# Patient Record
Sex: Female | Born: 1990 | Race: Black or African American | Hispanic: No | Marital: Single | Smoking: Current every day smoker
Health system: Southern US, Community
[De-identification: ages and names within clinical notes are randomized; demographics above are authoritative.]

## PROBLEM LIST (undated history)

## (undated) DIAGNOSIS — R87619 Unspecified abnormal cytological findings in specimens from cervix uteri: Secondary | ICD-10-CM

## (undated) DIAGNOSIS — IMO0002 Reserved for concepts with insufficient information to code with codable children: Secondary | ICD-10-CM

## (undated) DIAGNOSIS — A749 Chlamydial infection, unspecified: Secondary | ICD-10-CM

## (undated) DIAGNOSIS — J302 Other seasonal allergic rhinitis: Secondary | ICD-10-CM

## (undated) HISTORY — PX: NO PAST SURGERIES: SHX2092

---
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
## 2012-04-07 ENCOUNTER — Encounter (HOSPITAL_COMMUNITY): Payer: Self-pay | Admitting: *Deleted

## 2012-04-07 ENCOUNTER — Emergency Department (HOSPITAL_COMMUNITY)
Admission: EM | Admit: 2012-04-07 | Discharge: 2012-04-07 | Disposition: A | Discharge status: Home / Self Care | Payer: Medicaid Other | Attending: Emergency Medicine | Admitting: Emergency Medicine

## 2012-04-07 DIAGNOSIS — Z3202 Encounter for pregnancy test, result negative: Secondary | ICD-10-CM | POA: Insufficient documentation

## 2012-04-07 DIAGNOSIS — N926 Irregular menstruation, unspecified: Secondary | ICD-10-CM

## 2012-04-07 DIAGNOSIS — Z87891 Personal history of nicotine dependence: Secondary | ICD-10-CM | POA: Insufficient documentation

## 2012-04-07 DIAGNOSIS — N898 Other specified noninflammatory disorders of vagina: Secondary | ICD-10-CM | POA: Insufficient documentation

## 2012-04-07 DIAGNOSIS — J45909 Unspecified asthma, uncomplicated: Secondary | ICD-10-CM | POA: Insufficient documentation

## 2012-04-07 LAB — PREGNANCY, URINE: Preg Test, Ur: NEGATIVE

## 2012-04-07 LAB — COMPREHENSIVE METABOLIC PANEL
Alkaline Phosphatase: 75 U/L (ref 39–117)
BUN: 7 mg/dL (ref 6–23)
Creatinine, Ser: 0.7 mg/dL (ref 0.50–1.10)
GFR calc Af Amer: >90 mL/min (ref >90)
Glucose, Bld: 86 mg/dL (ref 70–99)
Potassium: 3.3 mEq/L — ABNORMAL LOW (ref 3.5–5.1)
Total Bilirubin: 0.2 mg/dL — ABNORMAL LOW (ref 0.3–1.2)
Total Protein: 8.4 g/dL — ABNORMAL HIGH (ref 6.0–8.3)

## 2012-04-07 LAB — CBC WITH DIFFERENTIAL/PLATELET
Basophils Absolute: 0 K/uL (ref 0.0–0.1)
Basophils Relative: 0 % (ref 0–1)
Eosinophils Absolute: 0.2 K/uL (ref 0.0–0.7)
Eosinophils Relative: 3 % (ref 0–5)
HCT: 42.5 % (ref 36.0–46.0)
Hemoglobin: 14.5 g/dL (ref 12.0–15.0)
Lymphocytes Relative: 56 % — ABNORMAL HIGH (ref 12–46)
Lymphs Abs: 4.3 K/uL — ABNORMAL HIGH (ref 0.7–4.0)
MCH: 29.8 pg (ref 26.0–34.0)
MCHC: 34.1 g/dL (ref 30.0–36.0)
MCV: 87.4 fL (ref 78.0–100.0)
Monocytes Absolute: 0.6 K/uL (ref 0.1–1.0)
Monocytes Relative: 8 % (ref 3–12)
Neutro Abs: 2.5 K/uL (ref 1.7–7.7)
Neutrophils Relative %: 33 % — ABNORMAL LOW (ref 43–77)
Platelets: 355 K/uL (ref 150–400)
RBC: 4.86 MIL/uL (ref 3.87–5.11)
RDW: 11.8 % (ref 11.5–15.5)
WBC: 7.6 K/uL (ref 4.0–10.5)

## 2012-04-07 LAB — URINALYSIS, ROUTINE W REFLEX MICROSCOPIC
Bilirubin Urine: NEGATIVE
Glucose, UA: NEGATIVE mg/dL
Hgb urine dipstick: NEGATIVE
Ketones, ur: NEGATIVE mg/dL
Protein, ur: NEGATIVE mg/dL
pH: 7 (ref 5.0–8.0)

## 2012-04-07 NOTE — ED Notes (Signed)
The pt is here because her period was not as heavy as usual.  Her period started on the 5th and ended today.  She felt lightheaded when she woke up from her nap today.  lmp sept 5th

## 2012-04-07 NOTE — ED Provider Notes (Signed)
History     CSN: [id number]  Arrival date & time 04/07/12  1932   First MD Initiated Contact with Patient 04/07/12 2124      Chief Complaint  Patient presents with  . difficulty with periods     (Consider location/radiation/quality/duration/timing/severity/associated sxs/prior treatment) HPI Comments: Because her period was lighter than normal wants to make sure she isn't pregnant  Patient is a 21 y.o. female presenting with vaginal bleeding. The history is provided by the patient.  Vaginal Bleeding Chronicity: lighter periods than normal. Episode onset: for the last 5 days. The problem has been gradually improving. Pertinent negatives include no chest pain, no abdominal pain, no headaches and no shortness of breath. Nothing aggravates the symptoms. Nothing relieves the symptoms. She has tried nothing for the symptoms.    Past Medical History  Diagnosis Date  . Asthma   . Abnormal Pap smear     repeat WNL  . Chlamydia 2011    treated  . Seasonal allergies     Past Surgical History  Procedure Date  . No past surgeries     Family History  Problem Relation Age of Onset  . Anesthesia problems Neg Hx     History  Substance Use Topics  . Smoking status: Former Smoker    Types: Cigarettes  . Smokeless tobacco: Not on file  . Alcohol Use: No    OB History    Grav Para Term Preterm Abortions TAB SAB Ect Mult Living   0 0 0 0 0 0 0 0 0 0       Review of Systems  Respiratory: Negative for shortness of breath.   Cardiovascular: Negative for chest pain.  Gastrointestinal: Negative for abdominal pain.  Genitourinary: Positive for vaginal bleeding.  Neurological: Negative for headaches.  All other systems reviewed and are negative.    Allergies  Shellfish allergy and Medroxyprogesterone acetate  Home Medications   Current Outpatient Rx  Name Route Sig Dispense Refill  . ALBUTEROL SULFATE HFA 108 (90 BASE) MCG/ACT IN AERS Inhalation Inhale 2 puffs into the  lungs every 6 (six) hours as needed. weezing      BP 126/71  Pulse 86  Temp 98.8 F (37.1 C) (Oral)  Resp 20  SpO2 96%  LMP 04/02/2012  Physical Exam  Nursing note and vitals reviewed. Constitutional: She is oriented to person, place, and time. She appears well-developed and well-nourished. No distress.  HENT:  Head: Normocephalic and atraumatic.  Eyes: EOM are normal. Pupils are equal, round, and reactive to light.  Cardiovascular: Normal rate, regular rhythm, normal heart sounds and intact distal pulses.  Exam reveals no friction rub.   No murmur heard. Pulmonary/Chest: Effort normal and breath sounds normal. She has no wheezes. She has no rales.  Abdominal: Soft. Bowel sounds are normal. She exhibits no distension. There is no tenderness. There is no rebound and no guarding.  Musculoskeletal: Normal range of motion. She exhibits no tenderness.       No edema  Neurological: She is alert and oriented to person, place, and time. No cranial nerve deficit.  Skin: Skin is warm and dry. No rash noted.  Psychiatric: She has a normal mood and affect. Her behavior is normal.    ED Course  Procedures (including critical care time)  Labs Reviewed  CBC WITH DIFFERENTIAL - Abnormal; Notable for the following:    Neutrophils Relative 33 (*)     Lymphocytes Relative 56 (*)     Lymphs Abs 4.3 (*)  All other components within normal limits  COMPREHENSIVE METABOLIC PANEL - Abnormal; Notable for the following:    Potassium 3.3 (*)     Total Protein 8.4 (*)     Total Bilirubin 0.2 (*)     All other components within normal limits  URINALYSIS, ROUTINE W REFLEX MICROSCOPIC  PREGNANCY, URINE   No results found.   No diagnosis found.    MDM   Patient is here because her period was lighter than normal this month. She denies missing any menses and denies any other symptoms. Patient practices unprotected sex with one partner. She denies any vaginal itching or discharge. Denies any  dysuria.  UA negative and UPT negative. Patient states she came to make sure she was not pregnant. She has no significant exam findings        Gwyneth Sprout, MD 04/07/12 2204

## 2012-11-04 ENCOUNTER — Encounter (HOSPITAL_COMMUNITY): Payer: Self-pay | Admitting: Emergency Medicine

## 2012-11-04 ENCOUNTER — Emergency Department (HOSPITAL_COMMUNITY)
Admission: EM | Admit: 2012-11-04 | Discharge: 2012-11-05 | Disposition: A | Discharge status: Home / Self Care | Payer: Medicaid Other | Attending: Emergency Medicine | Admitting: Emergency Medicine

## 2012-11-04 DIAGNOSIS — R197 Diarrhea, unspecified: Secondary | ICD-10-CM | POA: Insufficient documentation

## 2012-11-04 DIAGNOSIS — R112 Nausea with vomiting, unspecified: Secondary | ICD-10-CM | POA: Insufficient documentation

## 2012-11-04 DIAGNOSIS — R1032 Left lower quadrant pain: Secondary | ICD-10-CM | POA: Insufficient documentation

## 2012-11-04 DIAGNOSIS — Z8619 Personal history of other infectious and parasitic diseases: Secondary | ICD-10-CM | POA: Insufficient documentation

## 2012-11-04 DIAGNOSIS — Z79899 Other long term (current) drug therapy: Secondary | ICD-10-CM | POA: Insufficient documentation

## 2012-11-04 DIAGNOSIS — Z3202 Encounter for pregnancy test, result negative: Secondary | ICD-10-CM | POA: Insufficient documentation

## 2012-11-04 DIAGNOSIS — F17200 Nicotine dependence, unspecified, uncomplicated: Secondary | ICD-10-CM | POA: Insufficient documentation

## 2012-11-04 DIAGNOSIS — J45909 Unspecified asthma, uncomplicated: Secondary | ICD-10-CM | POA: Insufficient documentation

## 2012-11-04 LAB — URINALYSIS, MICROSCOPIC ONLY
Ketones, ur: NEGATIVE mg/dL
Leukocytes, UA: NEGATIVE
Nitrite: NEGATIVE
Protein, ur: NEGATIVE mg/dL
pH: 5.5 (ref 5.0–8.0)

## 2012-11-04 NOTE — ED Notes (Addendum)
C/o diarrhea x 1 and vomited x 2 today. Denies pain. LMP in February.

## 2012-11-05 MED ORDER — ONDANSETRON 4 MG PO TBDP
8.0000 mg | ORAL_TABLET | Freq: Once | ORAL | Status: AC
  Administered 2012-11-05: 8 mg via ORAL
  Filled 2012-11-05: qty 2

## 2012-11-05 MED ORDER — ONDANSETRON 8 MG PO TBDP: ORAL_TABLET | ORAL | Status: DC

## 2012-11-05 NOTE — ED Notes (Signed)
The pt reports that she has not had nv since she was given the med here

## 2012-11-05 NOTE — ED Notes (Signed)
The pt has had nv and diarrhea  Since this am .  She ate at the [employer] yesterday and members of her party have also been vomiting  Everyone that ate there.

## 2012-11-05 NOTE — ED Provider Notes (Signed)
History  This chart was scribed for Teshawn Moan Smitty Cords, MD by Shari Heritage, ED Scribe. The patient was seen in room D34C/D34C. Patient's care was started at 0001.  CSN: [id number]  Arrival date & time 11/04/12  2051   First MD Initiated Contact with Patient 11/05/12 0001      Chief Complaint  Patient presents with  . Emesis     Patient is a 22 y.o. female presenting with vomiting. The history is provided by the patient. No language interpreter was used.  Emesis Severity:  Mild Duration:  1 day Timing:  Sporadic Number of daily episodes:  2 Quality:  Stomach contents Progression:  Unchanged Chronicity:  New Relieved by:  Nothing Worsened by:  Nothing tried Ineffective treatments:  None tried Associated symptoms: abdominal pain and diarrhea   Associated symptoms: no chills, no headaches and no sore throat   Abdominal pain:    Location:  LLQ   Quality:  Cramping   Severity:  Mild   Timing:  Rare   Progression:  Unchanged   Chronicity:  New Diarrhea:    Quality:  Watery   Number of occurrences:  2   Severity:  Mild   Duration:  1 day   Timing:  Sporadic   Progression:  Unchanged    HPI Comments: Katherine Clay is a 22 y.o. female who presents to the Emergency Department complaining of diarrhea and emesis onset yesterday morning. She reports 2 episodes of vomiting and diarrhea each. Her last episodes of each were immediately prior to arrival.  There is associated intermittent abdominal cramping. Patient states that she came in contact with her nephew who attends daycare where several other children have exhibited similar symptoms. She denies fever, chills, sore throat, back pain, flank pain, chest pain, shortness of breath, rash, dysuria, hematuria, headaches, urinary frequency, or visual changes. She has a medical history of asthma. She is a current every day smoker and does not alcohol.  Past Medical History  Diagnosis Date  . Asthma   . Abnormal Pap smear     repeat  WNL  . Chlamydia 2011    treated  . Seasonal allergies     Past Surgical History  Procedure Laterality Date  . No past surgeries      Family History  Problem Relation Age of Onset  . Anesthesia problems Neg Hx     History  Substance Use Topics  . Smoking status: Current Every Day Smoker    Types: Cigarettes  . Smokeless tobacco: Not on file  . Alcohol Use: No    OB History   Grav Para Term Preterm Abortions TAB SAB Ect Mult Living   0 0 0 0 0 0 0 0 0 0       Review of Systems  Constitutional: Negative for fever and chills.  HENT: Negative for sore throat, neck pain and neck stiffness.   Eyes: Negative for visual disturbance.  Respiratory: Negative for shortness of breath.   Cardiovascular: Negative for chest pain.  Gastrointestinal: Positive for vomiting, abdominal pain and diarrhea.  Genitourinary: Negative for dysuria, urgency, frequency, hematuria and flank pain.  Musculoskeletal: Negative for back pain.  Skin: Negative for rash.  Neurological: Negative for headaches.  All other systems reviewed and are negative.    Allergies  Shellfish allergy and Medroxyprogesterone acetate  Home Medications   Current Outpatient Rx  Name  Route  Sig  Dispense  Refill  . albuterol (PROVENTIL HFA;VENTOLIN HFA) 108 (90 BASE) MCG/ACT inhaler   Inhalation  Inhale 2 puffs into the lungs every 6 (six) hours as needed. weezing         . albuterol (PROVENTIL) (2.5 MG/3ML) 0.083% nebulizer solution   Nebulization   Take 2.5 mg by nebulization every 6 (six) hours as needed for wheezing or shortness of breath (during allergy flare ups).           Triage Vitals: BP 122/66  Pulse 96  Temp(Src) 98.4 F (36.9 C) (Oral)  Resp 18  SpO2 97%  LMP 09/12/2012  Physical Exam  Constitutional: She is oriented to person, place, and time. She appears well-developed and well-nourished. No distress.  HENT:  Head: Normocephalic and atraumatic.  Mouth/Throat: Oropharynx is clear  and moist.  Eyes: Conjunctivae and EOM are normal. Pupils are equal, round, and reactive to light.  Neck: Normal range of motion. Neck supple.  Cardiovascular: Normal rate, regular rhythm and normal heart sounds.   Pulmonary/Chest: Effort normal and breath sounds normal.  Abdominal: Soft. She exhibits no distension and no mass. There is no tenderness. There is no rebound and no guarding.  Hyperactive bowel sounds.  Musculoskeletal: Normal range of motion.  Neurological: She is alert and oriented to person, place, and time. She has normal reflexes. She displays normal reflexes.  Skin: Skin is warm and dry. No rash noted. No erythema.  Psychiatric: She has a normal mood and affect.    ED Course  Procedures (including critical care time) DIAGNOSTIC STUDIES: Oxygen Saturation is 97% on room air, adequate by my interpretation.    COORDINATION OF CARE: 12:21 AM- Patient informed of current plan for treatment and evaluation and agrees with plan at this time.    Labs Reviewed  URINALYSIS, MICROSCOPIC ONLY - Abnormal; Notable for the following:    Squamous Epithelial / LPF FEW (*)    All other components within normal limits  POCT PREGNANCY, URINE    No results found.   No diagnosis found.    MDM  Patient is a 22 y.o. Female who presents after 1 day of vomiting and diarrhea. Patient had no significant abdominal tenderness on exam. UA was unremarkable. Suspect gastroenteritis. Gave Zofran-ODT in the ED. Patient tolerating PO in the ED.  Return for worsening symptoms.      I personally performed the services described in this documentation, which was scribed in my presence. The recorded information has been reviewed and is accurate.    Jasmine Awe, MD 11/05/12 [phone]

## 2013-08-20 IMAGING — CR DG CHEST 2V
2 series · 2 of 2 positions shown · non-contrast
Comparison: 05/09/2011.

CLINICAL DATA: 21-year-old female with cough, shortness of breath.

CHEST - 2 VIEW

[w chest pa]
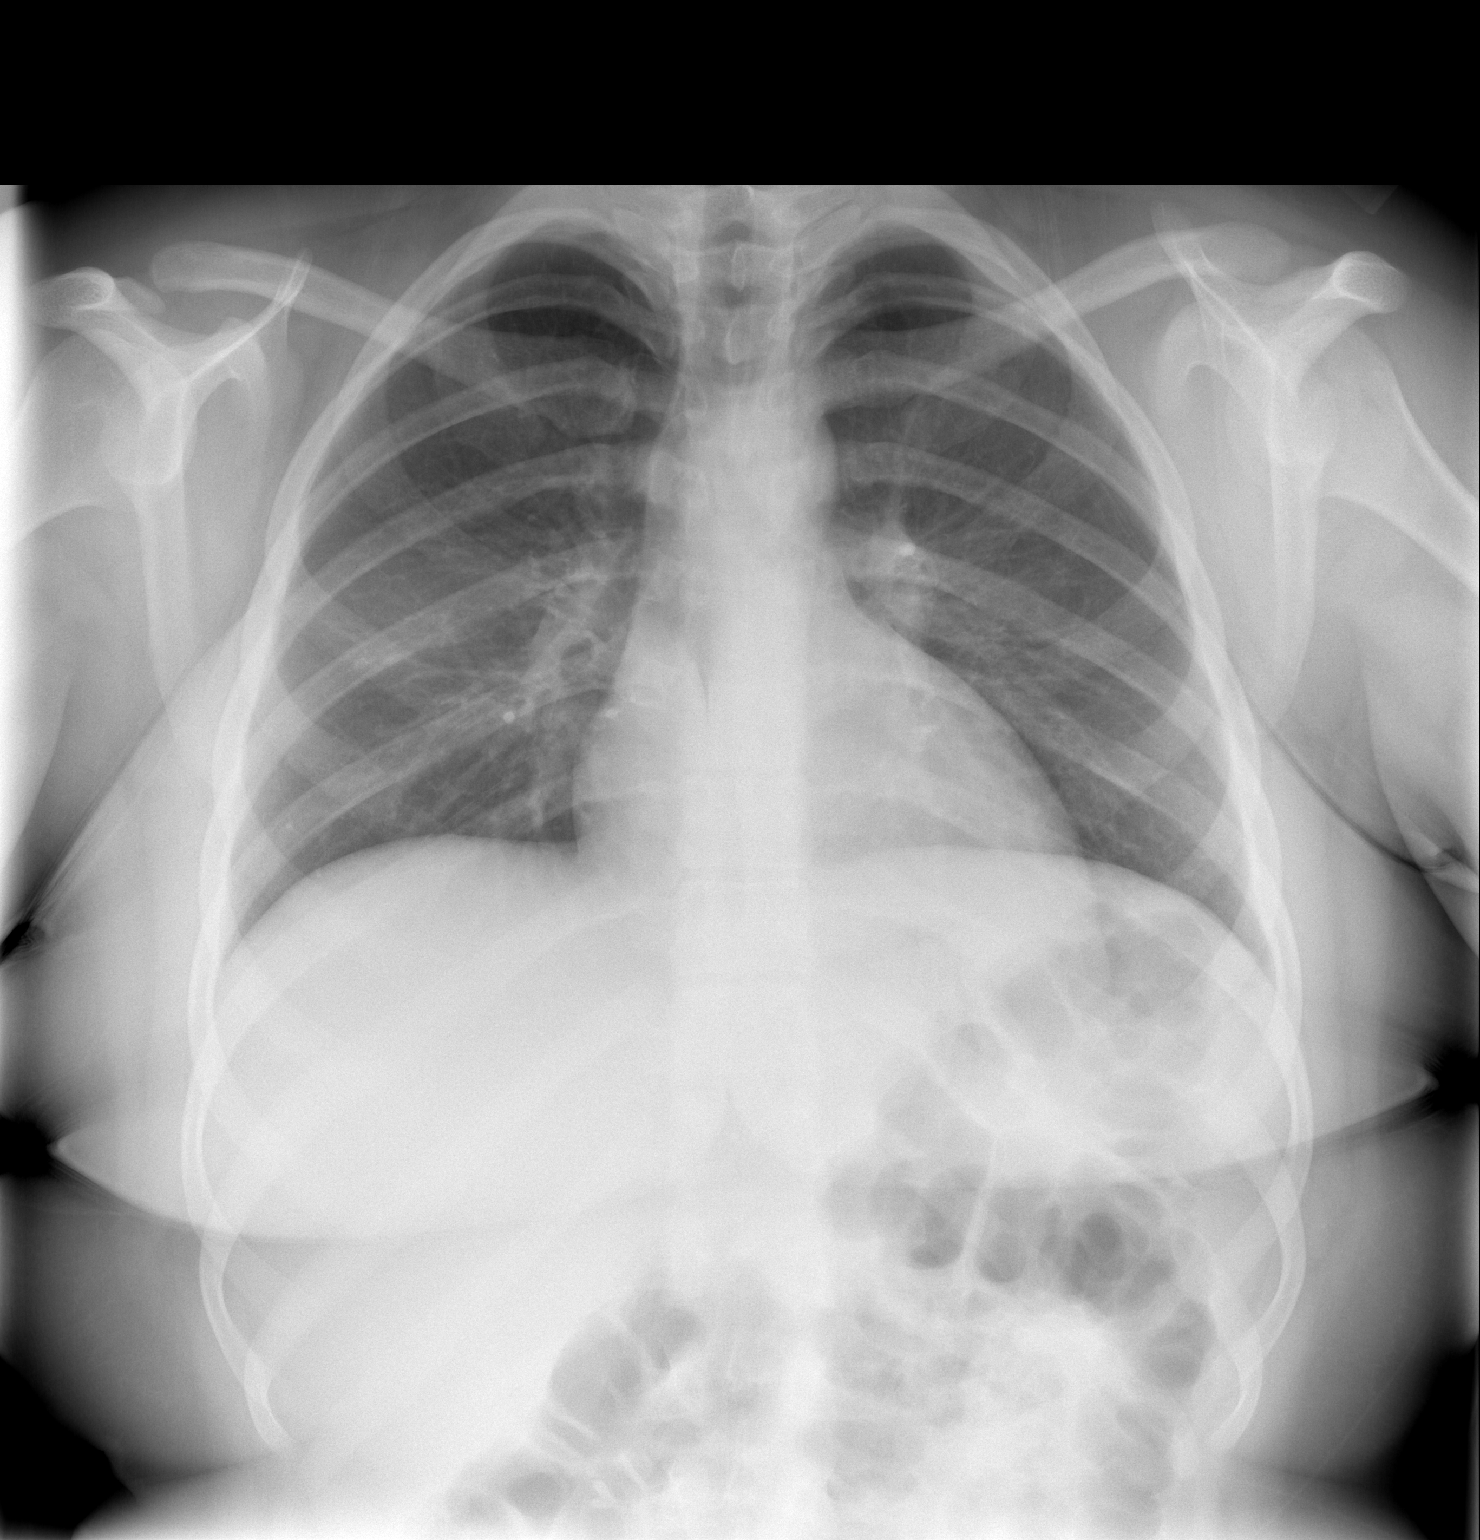

[w chest lat]
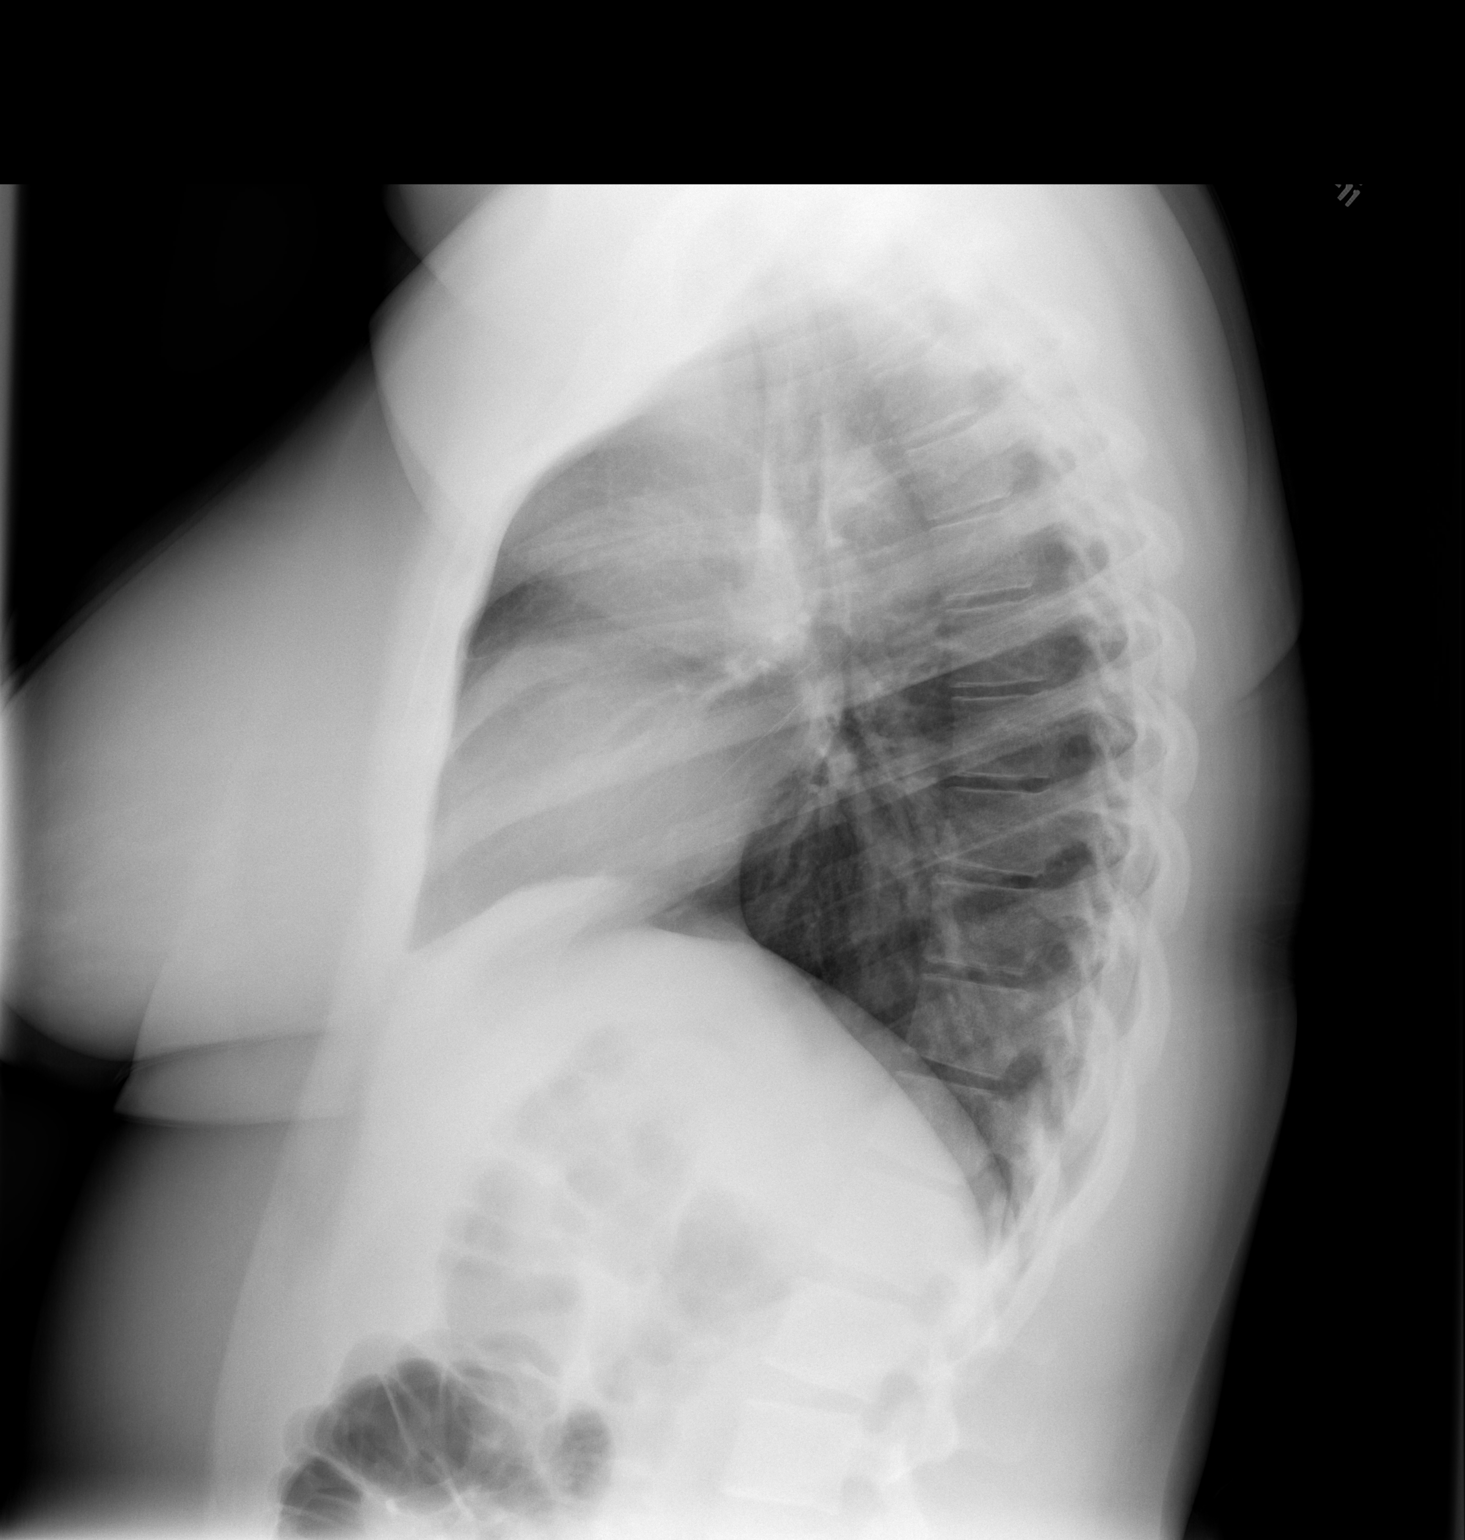

[2 of 2 positions shown; findings below may reference images not displayed]

FINDINGS: Slightly lower lung volumes.  Cardiac size and
mediastinal contours are within normal limits.  The lungs are
clear.  No pneumothorax or effusion.  Negative visualized bowel gas
and osseous structures.
IMPRESSION: No acute cardiopulmonary abnormality.

## 2014-04-30 ENCOUNTER — Emergency Department (HOSPITAL_COMMUNITY)
Admission: EM | Admit: 2014-04-30 | Discharge: 2014-04-30 | Disposition: A | Discharge status: Home / Self Care | Payer: Medicaid Other | Attending: Emergency Medicine | Admitting: Emergency Medicine

## 2014-04-30 ENCOUNTER — Encounter (HOSPITAL_COMMUNITY): Payer: Self-pay | Admitting: Emergency Medicine

## 2014-04-30 DIAGNOSIS — Z8619 Personal history of other infectious and parasitic diseases: Secondary | ICD-10-CM | POA: Diagnosis not present

## 2014-04-30 DIAGNOSIS — J45901 Unspecified asthma with (acute) exacerbation: Secondary | ICD-10-CM | POA: Diagnosis not present

## 2014-04-30 DIAGNOSIS — J0191 Acute recurrent sinusitis, unspecified: Secondary | ICD-10-CM | POA: Diagnosis not present

## 2014-04-30 DIAGNOSIS — Z720 Tobacco use: Secondary | ICD-10-CM | POA: Diagnosis not present

## 2014-04-30 DIAGNOSIS — J069 Acute upper respiratory infection, unspecified: Secondary | ICD-10-CM | POA: Insufficient documentation

## 2014-04-30 DIAGNOSIS — J019 Acute sinusitis, unspecified: Secondary | ICD-10-CM

## 2014-04-30 DIAGNOSIS — R05 Cough: Secondary | ICD-10-CM | POA: Diagnosis present

## 2014-04-30 MED ORDER — ALBUTEROL SULFATE HFA 108 (90 BASE) MCG/ACT IN AERS: 1.0000 | INHALATION_SPRAY | Freq: Four times a day (QID) | RESPIRATORY_TRACT | Status: AC | PRN

## 2014-04-30 MED ORDER — BENZONATATE 100 MG PO CAPS: 100.0000 mg | ORAL_CAPSULE | Freq: Three times a day (TID) | ORAL | Status: AC

## 2014-04-30 MED ORDER — AZITHROMYCIN 250 MG PO TABS: 250.0000 mg | ORAL_TABLET | Freq: Every day | ORAL | Status: AC

## 2014-04-30 MED ORDER — ALBUTEROL SULFATE (2.5 MG/3ML) 0.083% IN NEBU
5.0000 mg | INHALATION_SOLUTION | Freq: Once | RESPIRATORY_TRACT | Status: AC
  Administered 2014-04-30: 5 mg via RESPIRATORY_TRACT
  Filled 2014-04-30: qty 6

## 2014-04-30 MED ORDER — PREDNISONE 20 MG PO TABS
60.0000 mg | ORAL_TABLET | Freq: Once | ORAL | Status: AC
  Administered 2014-04-30: 60 mg via ORAL
  Filled 2014-04-30: qty 3

## 2014-04-30 MED ORDER — PREDNISONE 20 MG PO TABS: 60.0000 mg | ORAL_TABLET | Freq: Every day | ORAL | Status: AC

## 2014-04-30 MED ORDER — IPRATROPIUM BROMIDE 0.02 % IN SOLN
0.5000 mg | Freq: Once | RESPIRATORY_TRACT | Status: AC
  Administered 2014-04-30: 0.5 mg via RESPIRATORY_TRACT
  Filled 2014-04-30: qty 2.5

## 2014-04-30 NOTE — ED Notes (Signed)
Pt reports cough, congestion x 3 days; pt reports sinus and chest congestion; NP cough; denies difficulty breathing

## 2014-04-30 NOTE — ED Provider Notes (Signed)
Medical screening examination/treatment/procedure(s) were performed by non-physician practitioner and as supervising physician I was immediately available for consultation/collaboration.   EKG Interpretation None        Polette Nofsinger, MD 04/30/14 0602 

## 2014-04-30 NOTE — ED Provider Notes (Signed)
CSN: [id number]     Arrival date & time 04/30/14  0218 History   First MD Initiated Contact with Patient 04/30/14 [phone]     Chief Complaint  Patient presents with  . URI     (Consider location/radiation/quality/duration/timing/severity/associated sxs/prior Treatment) HPI Comments: Patient with a history of Asthma presents today with a chief complaint of non productive cough, nasal congestion, and sinus pain that has been present for the past 3 days.  Symptoms gradually worsening.  She also reports that today she began wheezing and had tightness in her chest.  She has not taken anything for her symptoms.  She states that she is out of her inhaler.  She denies fever, chills, chest pain, nausea, or vomiting.  The history is provided by the patient.    Past Medical History  Diagnosis Date  . Asthma   . Abnormal Pap smear     repeat WNL  . Chlamydia 2011    treated  . Seasonal allergies    Past Surgical History  Procedure Laterality Date  . No past surgeries     Family History  Problem Relation Age of Onset  . Anesthesia problems Neg Hx    History  Substance Use Topics  . Smoking status: Current Every Day Smoker    Types: Cigarettes  . Smokeless tobacco: Not on file  . Alcohol Use: No   OB History   Grav Para Term Preterm Abortions TAB SAB Ect Mult Living   0 0 0 0 0 0 0 0 0 0      Review of Systems  All other systems reviewed and are negative.     Allergies  Shellfish allergy and Medroxyprogesterone acetate  Home Medications   Prior to Admission medications   Not on File   BP 131/62  Pulse 85  Temp(Src) 98.6 F (37 C) (Oral)  Resp 20  Wt 190 lb 12.8 oz (86.546 kg)  SpO2 97%  LMP 03/21/2014 Physical Exam  Nursing note and vitals reviewed. Constitutional: She appears well-developed and well-nourished.  HENT:  Head: Normocephalic and atraumatic.  Right Ear: Tympanic membrane and ear canal normal.  Left Ear: Tympanic membrane and ear canal normal.  Nose:  Right sinus exhibits frontal sinus tenderness. Left sinus exhibits frontal sinus tenderness.  Mouth/Throat: Uvula is midline, oropharynx is clear and moist and mucous membranes are normal.  Neck: Normal range of motion. Neck supple.  Cardiovascular: Normal rate, regular rhythm and normal heart sounds.   Pulmonary/Chest: Effort normal. No respiratory distress. She has wheezes. She has no rales. She exhibits no tenderness.  Diffuse expiratory wheezing Patient able to speak in complete sentences  Neurological: She is alert.  Skin: Skin is warm and dry.  Psychiatric: She has a normal mood and affect.    ED Course  Procedures (including critical care time) Labs Review Labs Reviewed - No data to display  Imaging Review No results found.   EKG Interpretation None     4:18 AM Reassessed patient.  She reports that her breathing has improved.  Lungs CTAB.   MDM   Final diagnoses:  Asthma exacerbation  URI (upper respiratory infection)  Acute sinusitis, recurrence not specified, unspecified location   Patient with a history of Asthma presents today with a chief complaint of cough, wheezing, sinus pain, and congestion x 3 days.  On exam, no respiratory distress.  Patient given Prednisone and breathing treatment and wheezing resolved.  Patient felt like her breathing was back at baseline at the time of discharge.  Pulse ox 97 on RA.  Patient discharged with Rx for Albuterol inhaler and 5 day course of Prednisone.  Patient stable for discharge.  Return precautions given.    Santiago Glad, PA-C 04/30/14 [phone]

## 2014-08-17 ENCOUNTER — Emergency Department (HOSPITAL_COMMUNITY)
Admission: EM | Admit: 2014-08-17 | Discharge: 2014-08-17 | Disposition: A | Discharge status: Home / Self Care | Payer: Medicaid Other | Attending: Emergency Medicine | Admitting: Emergency Medicine

## 2014-08-17 ENCOUNTER — Encounter (HOSPITAL_COMMUNITY): Payer: Self-pay | Admitting: *Deleted

## 2014-08-17 DIAGNOSIS — Z7952 Long term (current) use of systemic steroids: Secondary | ICD-10-CM | POA: Diagnosis not present

## 2014-08-17 DIAGNOSIS — Z8619 Personal history of other infectious and parasitic diseases: Secondary | ICD-10-CM | POA: Diagnosis not present

## 2014-08-17 DIAGNOSIS — Z792 Long term (current) use of antibiotics: Secondary | ICD-10-CM | POA: Diagnosis not present

## 2014-08-17 DIAGNOSIS — J45909 Unspecified asthma, uncomplicated: Secondary | ICD-10-CM | POA: Diagnosis not present

## 2014-08-17 DIAGNOSIS — Z720 Tobacco use: Secondary | ICD-10-CM | POA: Insufficient documentation

## 2014-08-17 DIAGNOSIS — L02411 Cutaneous abscess of right axilla: Secondary | ICD-10-CM | POA: Diagnosis present

## 2014-08-17 DIAGNOSIS — Z79899 Other long term (current) drug therapy: Secondary | ICD-10-CM | POA: Diagnosis not present

## 2014-08-17 MED ORDER — DOXYCYCLINE HYCLATE 100 MG PO CAPS: 100.0000 mg | ORAL_CAPSULE | Freq: Two times a day (BID) | ORAL | Status: AC

## 2014-08-17 MED ORDER — TRAMADOL HCL 50 MG PO TABS: 50.0000 mg | ORAL_TABLET | Freq: Four times a day (QID) | ORAL | Status: AC | PRN

## 2014-08-17 NOTE — ED Provider Notes (Signed)
CSN: [id number]     Arrival date & time 08/17/14  1515 History  This chart was scribed for non-physician practitioner working with Katherine SproutWhitney Plunkett, MD by [name], ED Scribe. This patient was seen in room WTR7/WTR7 and the patient's care was started at 4:04 PM.   Chief Complaint  Patient presents with  . Abscess   The history is provided by the patient. No language interpreter was used.   HPI Comments: Katherine Clay is a 24 y.o. female who presents to the Emergency Department with right axillary abscess, present for one week. Patient reports redness and swelling at the site. Patient reports opening this morning and foul drainage. Patient reports history of similar abscesses that have never required I&D. Patient states that this is the largest she's ever had.   Past Medical History  Diagnosis Date  . Asthma   . Abnormal Pap smear     repeat WNL  . Chlamydia 2011    treated  . Seasonal allergies    Past Surgical History  Procedure Laterality Date  . No past surgeries     Family History  Problem Relation Age of Onset  . Anesthesia problems Neg Hx    History  Substance Use Topics  . Smoking status: Current Every Day Smoker    Types: Cigarettes  . Smokeless tobacco: Not on file  . Alcohol Use: No   OB History    Gravida Para Term Preterm AB TAB SAB Ectopic Multiple Living   0 0 0 0 0 0 0 0 0 0      Review of Systems  Constitutional: Negative for fever and chills.  Skin: Positive for color change.       Abscess     Allergies  Shellfish allergy and Medroxyprogesterone acetate  Home Medications   Prior to Admission medications   Medication Sig Start Date End Date Taking? Authorizing Provider  albuterol (PROVENTIL HFA;VENTOLIN HFA) 108 (90 BASE) MCG/ACT inhaler Inhale 1-2 puffs into the lungs every 6 (six) hours as needed for wheezing or shortness of breath. Patient not taking: Reported on 08/17/2014 04/30/14   Santiago GladHeather Laisure, PA-C  azithromycin (ZITHROMAX) 250 MG  tablet Take 1 tablet (250 mg total) by mouth daily. Take first 2 tablets together, then 1 every day until finished. Patient not taking: Reported on 08/17/2014 04/30/14   Santiago GladHeather Laisure, PA-C  benzonatate (TESSALON) 100 MG capsule Take 1 capsule (100 mg total) by mouth every 8 (eight) hours. Patient not taking: Reported on 08/17/2014 04/30/14   Santiago GladHeather Laisure, PA-C  predniSONE (DELTASONE) 20 MG tablet Take 3 tablets (60 mg total) by mouth daily. Patient not taking: Reported on 08/17/2014 04/30/14   Santiago GladHeather Laisure, PA-C   Triage Vitals: Pulse 80  Temp(Src) 98 F (36.7 C) (Oral)  Resp 18  SpO2 100%  LMP 08/17/2014 Physical Exam  Constitutional: She is oriented to person, place, and time. She appears well-developed and well-nourished. No distress.  HENT:  Head: Normocephalic and atraumatic.  Eyes: EOM are normal.  Neck: Neck supple. No tracheal deviation present.  Cardiovascular: Normal rate.   Pulmonary/Chest: Effort normal. No respiratory distress.  Musculoskeletal: Normal range of motion.  Neurological: She is alert and oriented to person, place, and time.  Skin: Skin is warm and dry.  3 cm to the right axilla, draining purulent drainage. Mild surrounding erythema. Tender to palpation.  Psychiatric: She has a normal mood and affect. Her behavior is normal.  Nursing note and vitals reviewed.   ED Course  Procedures (including critical  care time)  COORDINATION OF CARE: 4:04 PM- Patient refuses I&D, deciding on antibiotic treatment and warm compresses at home. Patient given return precautions. Discussed treatment plan with patient at bedside and patient agreed to plan.   Labs Review Labs Reviewed - No data to display  Imaging Review No results found.   EKG Interpretation None      MDM   Final diagnoses:  Abscess of right axilla     patient with right axillary abscess for a week. It is draining at this time. She is refused incision and drainage, explained that it will heal  faster if we open and. Patient states she just wants antibiotics. Will discharge home with doxycycline, tramadol, follow-up as needed. Instructed to return if it's getting worse. She is afebrile, nontoxic appearing.  Filed Vitals:   08/17/14 1527  Pulse: 80  Temp: 98 F (36.7 C)  TempSrc: Oral  Resp: 18  SpO2: 100%     I personally performed the services described in this documentation, which was scribed in my presence. The recorded information has been reviewed and is accurate.    [name], PA-C 08/17/14 1838  Katherine Sprout, MD 08/18/14 2259

## 2014-08-17 NOTE — Discharge Instructions (Signed)
Ultram for pain. Doxycycline until all gone for infection. Continue warm compresses, soaks. Return if not improving.    Abscess An abscess is an infected area that contains a collection of pus and debris.It can occur in almost any part of the body. An abscess is also known as a furuncle or boil. CAUSES  An abscess occurs when tissue gets infected. This can occur from blockage of oil or sweat glands, infection of hair follicles, or a minor injury to the skin. As the body tries to fight the infection, pus collects in the area and creates pressure under the skin. This pressure causes pain. People with weakened immune systems have difficulty fighting infections and get certain abscesses more often.  SYMPTOMS Usually an abscess develops on the skin and becomes a painful mass that is red, warm, and tender. If the abscess forms under the skin, you may feel a moveable soft area under the skin. Some abscesses break open (rupture) on their own, but most will continue to get worse without care. The infection can spread deeper into the body and eventually into the bloodstream, causing you to feel ill.  DIAGNOSIS  Your caregiver will take your medical history and perform a physical exam. A sample of fluid may also be taken from the abscess to determine what is causing your infection. TREATMENT  Your caregiver may prescribe antibiotic medicines to fight the infection. However, taking antibiotics alone usually does not cure an abscess. Your caregiver may need to make a small cut (incision) in the abscess to drain the pus. In some cases, gauze is packed into the abscess to reduce pain and to continue draining the area. HOME CARE INSTRUCTIONS   Only take over-the-counter or prescription medicines for pain, discomfort, or fever as directed by your caregiver.  If you were prescribed antibiotics, take them as directed. Finish them even if you start to feel better.  If gauze is used, follow your caregiver's  directions for changing the gauze.  To avoid spreading the infection:  Keep your draining abscess covered with a bandage.  Wash your hands well.  Do not share personal care items, towels, or whirlpools with others.  Avoid skin contact with others.  Keep your skin and clothes clean around the abscess.  Keep all follow-up appointments as directed by your caregiver. SEEK MEDICAL CARE IF:   You have increased pain, swelling, redness, fluid drainage, or bleeding.  You have muscle aches, chills, or a general ill feeling.  You have a fever. MAKE SURE YOU:   Understand these instructions.  Will watch your condition.  Will get help right away if you are not doing well or get worse. Document Released: 04/24/2005 Document Revised: 01/14/2012 Document Reviewed: 09/27/2011 [facility] Patient Information 2015 [place], [place]. This information is not intended to replace advice given to you by your health care provider. Make sure you discuss any questions you have with your health care provider.

## 2014-08-17 NOTE — ED Notes (Signed)
Pt states she has had an abscess in her right underarm for the past week. Pt states it burst this morning and is draining foul discharge. Pt denies pain.

## 2016-04-26 LAB — PROCEDURE REPORT - SCANNED: PAP SMEAR: NEGATIVE
